# Patient Record
Sex: Female | Born: 1960 | Race: White
Health system: Southern US, Community
[De-identification: ages and names within clinical notes are randomized; demographics above are authoritative.]

## PROBLEM LIST (undated history)

## (undated) DIAGNOSIS — Z9889 Other specified postprocedural states: Secondary | ICD-10-CM

## (undated) HISTORY — DX: Other specified postprocedural states: Z98.890

---

## 2016-07-17 ENCOUNTER — Other Ambulatory Visit: Payer: Self-pay | Admitting: Rehabilitation

## 2016-07-17 DIAGNOSIS — M5432 Sciatica, left side: Principal | ICD-10-CM

## 2016-07-17 DIAGNOSIS — M5431 Sciatica, right side: Secondary | ICD-10-CM

## 2016-07-25 ENCOUNTER — Other Ambulatory Visit: Payer: Self-pay | Admitting: Rehabilitation

## 2016-07-25 ENCOUNTER — Ambulatory Visit
Admission: RE | Admit: 2016-07-25 | Discharge: 2016-07-25 | Disposition: A | Discharge status: Home / Self Care | Payer: BLUE CROSS/BLUE SHIELD | Source: Ambulatory Visit | Attending: Rehabilitation | Admitting: Rehabilitation

## 2016-07-25 DIAGNOSIS — M5432 Sciatica, left side: Principal | ICD-10-CM

## 2016-07-25 DIAGNOSIS — M5431 Sciatica, right side: Secondary | ICD-10-CM

## 2016-08-02 ENCOUNTER — Encounter: Payer: Self-pay | Admitting: Radiology

## 2016-08-02 ENCOUNTER — Ambulatory Visit
Admission: RE | Admit: 2016-08-02 | Discharge: 2016-08-02 | Disposition: A | Discharge status: Home / Self Care | Payer: BLUE CROSS/BLUE SHIELD | Source: Ambulatory Visit | Attending: Rehabilitation | Admitting: Rehabilitation

## 2016-08-02 DIAGNOSIS — M5432 Sciatica, left side: Principal | ICD-10-CM

## 2016-08-02 DIAGNOSIS — M5431 Sciatica, right side: Secondary | ICD-10-CM

## 2016-08-02 MED ORDER — GADOBENATE DIMEGLUMINE 529 MG/ML IV SOLN
17.0000 mL | Freq: Once | INTRAVENOUS | Status: AC | PRN
  Administered 2016-08-02: 17 mL via INTRAVENOUS

## 2017-05-06 ENCOUNTER — Other Ambulatory Visit: Payer: Self-pay | Admitting: Orthopaedic Surgery

## 2017-05-06 DIAGNOSIS — M546 Pain in thoracic spine: Secondary | ICD-10-CM

## 2017-05-11 ENCOUNTER — Ambulatory Visit
Admission: RE | Admit: 2017-05-11 | Discharge: 2017-05-11 | Disposition: A | Discharge status: Home / Self Care | Payer: BLUE CROSS/BLUE SHIELD | Source: Ambulatory Visit | Attending: Orthopaedic Surgery | Admitting: Orthopaedic Surgery

## 2017-05-11 DIAGNOSIS — M546 Pain in thoracic spine: Secondary | ICD-10-CM

## 2017-05-24 DIAGNOSIS — Z9689 Presence of other specified functional implants: Secondary | ICD-10-CM

## 2017-05-24 HISTORY — DX: Presence of other specified functional implants: Z96.89

## 2018-06-15 ENCOUNTER — Other Ambulatory Visit: Payer: Self-pay | Admitting: Orthopaedic Surgery

## 2018-06-15 DIAGNOSIS — M545 Low back pain: Secondary | ICD-10-CM

## 2018-06-22 ENCOUNTER — Other Ambulatory Visit: Payer: Self-pay | Admitting: Orthopaedic Surgery

## 2018-06-22 DIAGNOSIS — M545 Low back pain: Principal | ICD-10-CM

## 2018-06-22 DIAGNOSIS — G8929 Other chronic pain: Secondary | ICD-10-CM

## 2018-06-23 ENCOUNTER — Telehealth: Payer: Self-pay | Admitting: Nurse Practitioner

## 2018-06-23 NOTE — Telephone Encounter (Signed)
Phone call to patient to verify medication list and allergies for myelogram procedure. Pt instructed to stop Cymbalta, Phenergan, Imitrex, Trazodone and Elavil 48 hrs prior to myelogram appointment time. Pt verbalized understanding.

## 2018-07-05 ENCOUNTER — Ambulatory Visit
Admission: RE | Admit: 2018-07-05 | Discharge: 2018-07-05 | Disposition: A | Discharge status: Home / Self Care | Payer: BLUE CROSS/BLUE SHIELD | Source: Ambulatory Visit | Attending: Orthopaedic Surgery | Admitting: Orthopaedic Surgery

## 2018-07-05 DIAGNOSIS — M545 Low back pain: Principal | ICD-10-CM

## 2018-07-05 DIAGNOSIS — G8929 Other chronic pain: Secondary | ICD-10-CM

## 2018-07-05 MED ORDER — IOPAMIDOL (ISOVUE-M 200) INJECTION 41%
15.0000 mL | Freq: Once | INTRAMUSCULAR | Status: AC
  Administered 2018-07-05: 15 mL via INTRATHECAL

## 2018-07-05 MED ORDER — DIAZEPAM 5 MG PO TABS
10.0000 mg | ORAL_TABLET | Freq: Once | ORAL | Status: AC
  Administered 2018-07-05: 5 mg via ORAL

## 2018-07-05 MED ORDER — MEPERIDINE HCL 50 MG/ML IJ SOLN
50.0000 mg | Freq: Once | INTRAMUSCULAR | Status: AC
  Administered 2018-07-05: 50 mg via INTRAMUSCULAR

## 2018-07-05 MED ORDER — ONDANSETRON HCL 4 MG/2ML IJ SOLN
4.0000 mg | Freq: Once | INTRAMUSCULAR | Status: AC
  Administered 2018-07-05: 4 mg via INTRAMUSCULAR

## 2018-07-05 NOTE — Discharge Instructions (Signed)
Myelogram Discharge Instructions  1. Go home and rest quietly for the next 24 hours.  It is important to lie flat for the next 24 hours.  Get up only to go to the restroom.  You may lie in the bed or on a couch on your back, your stomach, your left side or your right side.  You may have one pillow under your head.  You may have pillows between your knees while you are on your side or under your knees while you are on your back.  2. DO NOT drive today.  Recline the seat as far back as it will go, while still wearing your seat belt, on the way home.  3. You may get up to go to the bathroom as needed.  You may sit up for 10 minutes to eat.  You may resume your normal diet and medications unless otherwise indicated.  Drink lots of extra fluids today and tomorrow.  4. The incidence of headache, nausea, or vomiting is about 5% (one in 20 patients).  If you develop a headache, lie flat and drink plenty of fluids until the headache goes away.  Caffeinated beverages may be helpful.  If you develop severe nausea and vomiting or a headache that does not go away with flat bed rest, call [phone].  5. You may resume normal activities after your 24 hours of bed rest is over; however, do not exert yourself strongly or do any heavy lifting tomorrow. If when you get up you have a headache when standing, go back to bed and force fluids for another 24 hours.  6. Call your physician for a follow-up appointment.  The results of your myelogram will be sent directly to your physician by the following day.  7. If you have any questions or if complications develop after you arrive home, please call [phone].  Discharge instructions have been explained to the patient.  The patient, or the person responsible for the patient, fully understands these instructions.   YOU MAY RESTART YOUR CYMBALTA, PHENERGAN, IMITREX, TRAZODONE AND ELAVIL TOMORROW 07/06/2018 AT 09:30AM.

## 2019-09-25 IMAGING — XA DG MYELOGRAPHY LUMBAR INJ LUMBOSACRAL
12 of 16 series · 12 of 16 positions shown · non-contrast
Comparison: MR 08/02/2016.

CLINICAL DATA: Low back pain. BILATERAL leg weakness has developed.
TECHNIQUE: Contiguous axial images were obtained through the Lumbar spine after
the intrathecal infusion of infusion. Coronal and sagittal
reconstructions were obtained of the axial image sets.

[Series 1: vasc adipose · 1 of 1 slices shown (1 of 12)]
[im 1/1]
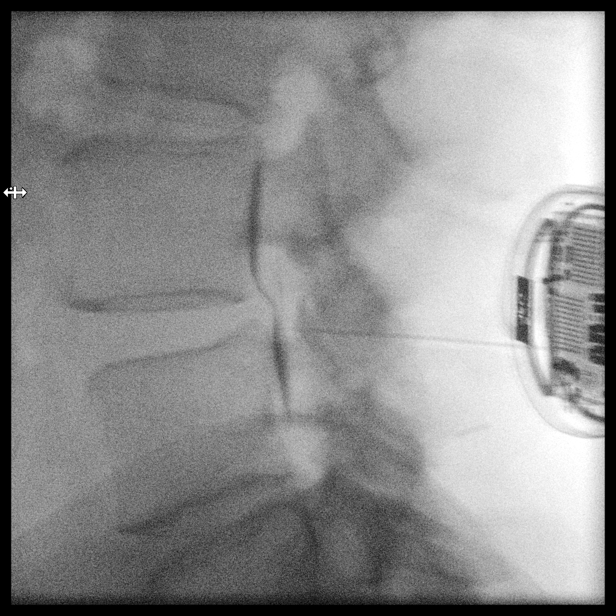

[Series 3: vasc adipose · 1 of 1 slices shown (2 of 12)]
[im 1/1]
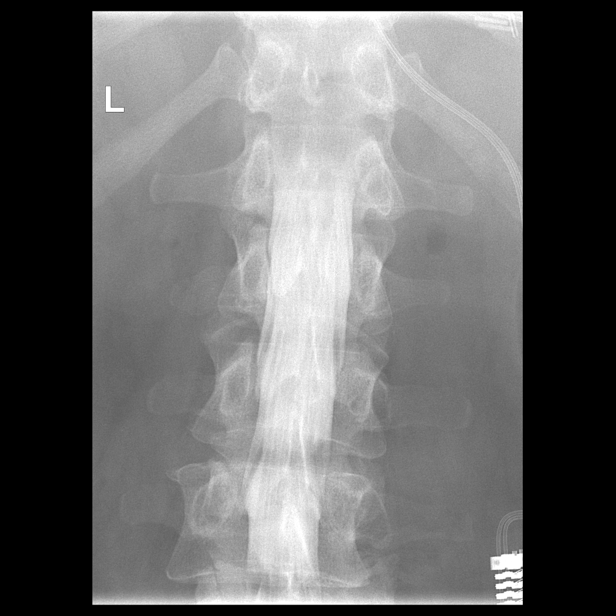

[Series 4: vasc adipose · 1 of 1 slices shown (3 of 12)]
[im 1/1]
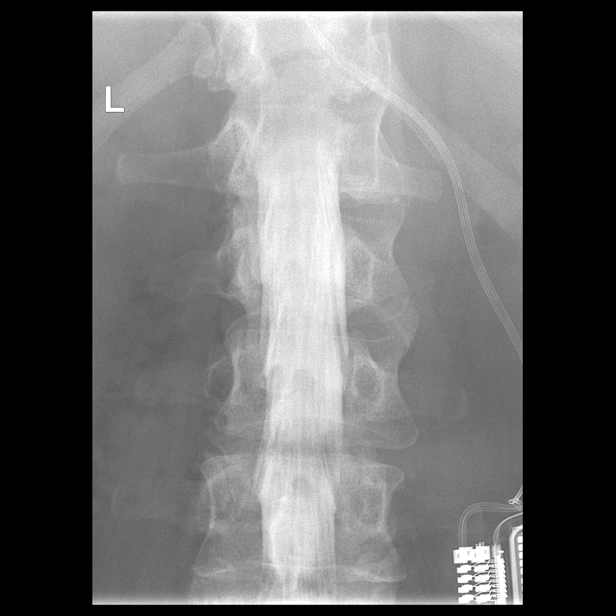

[Series 5: vasc adipose · 1 of 1 slices shown (4 of 12)]
[im 1/1]
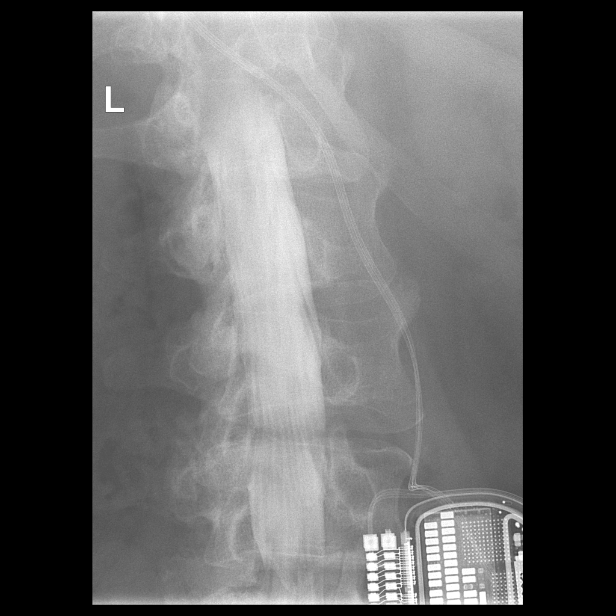

[Series 7: vasc adipose · 1 of 1 slices shown (5 of 12)]
[im 1/1]
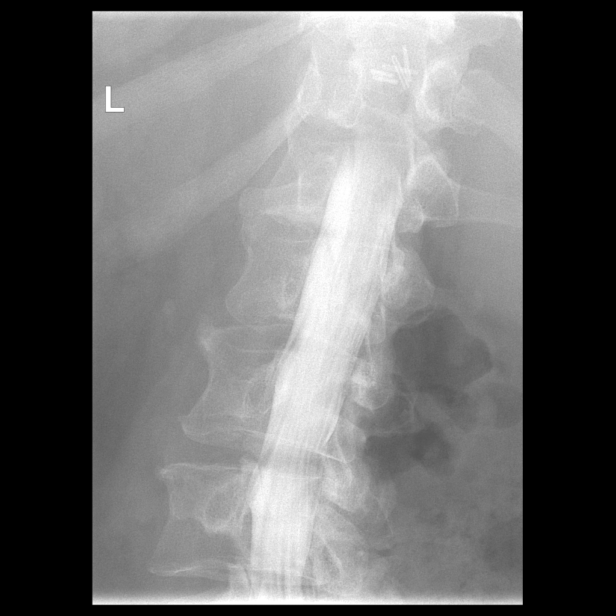

[Series 8: vasc adipose · 1 of 1 slices shown (6 of 12)]
[im 1/1]
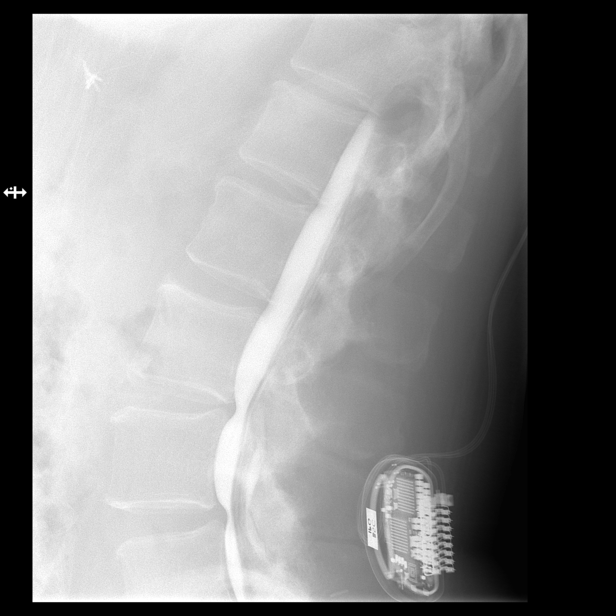

[Series 9: vasc adipose · 1 of 1 slices shown (7 of 12)]
[im 1/1]
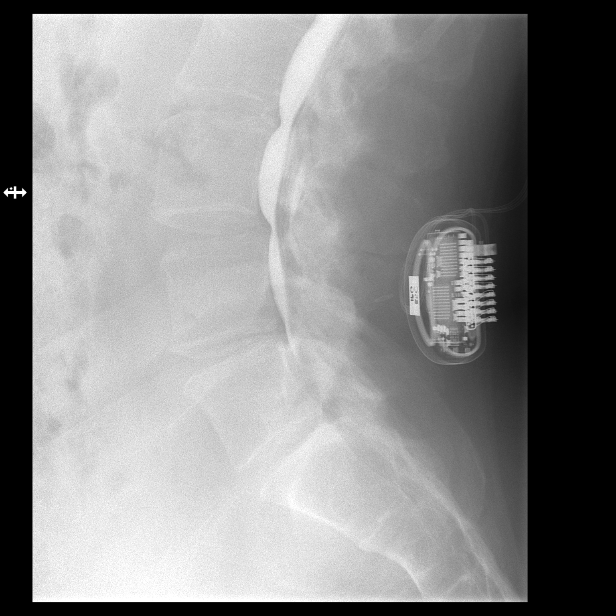

[Series 11: vasc adipose · 1 of 1 slices shown (8 of 12)]
[im 1/1]
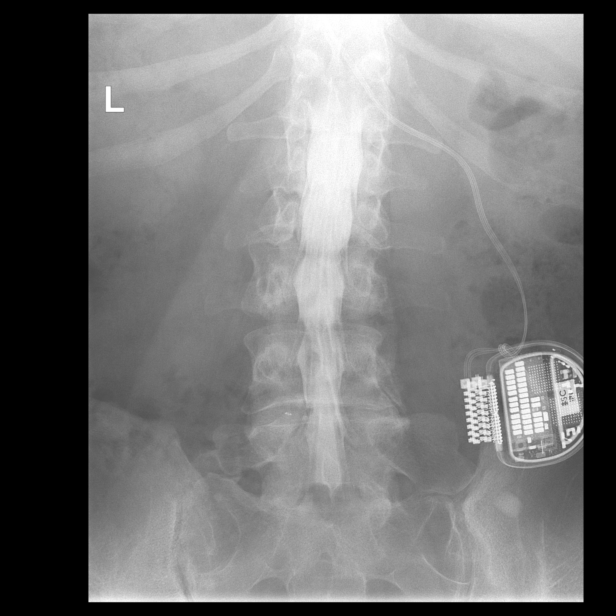

[Series 12: vasc adipose · 1 of 1 slices shown (9 of 12)]
[im 1/1]
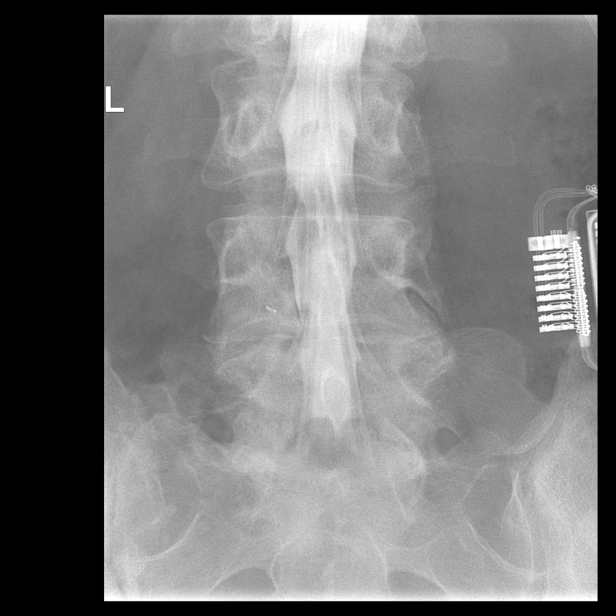

[Series 13: vasc adipose · 1 of 1 slices shown (10 of 12)]
[im 1/1]
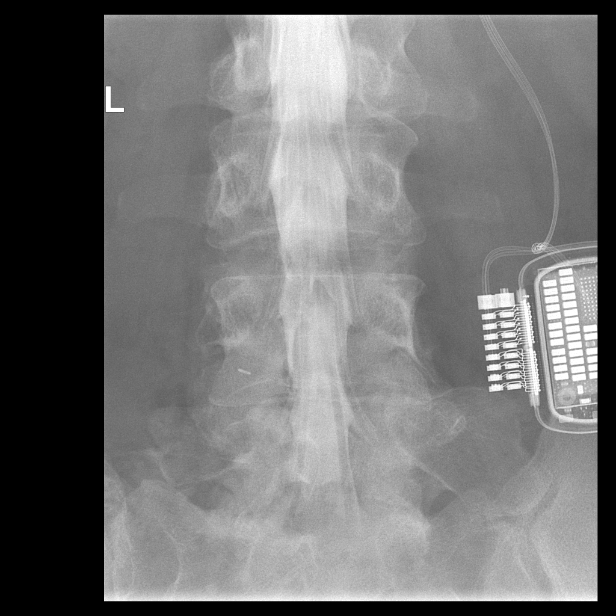

[Series 15: vasc adipose · 1 of 1 slices shown (11 of 12)]
[im 1/1]
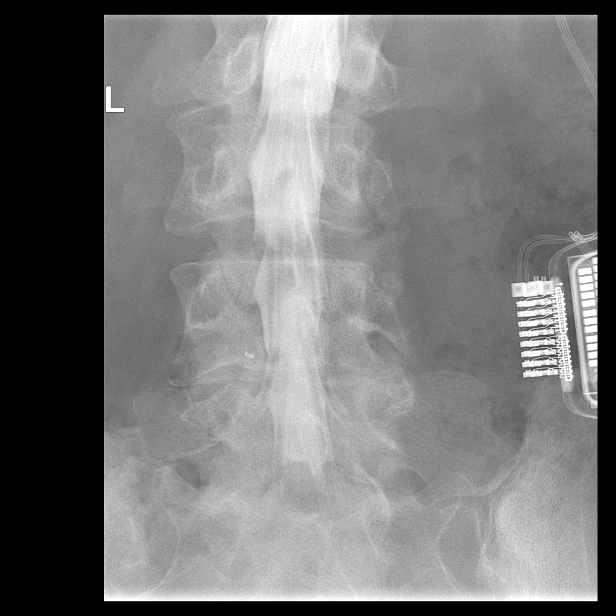

[Series 16: vasc adipose · 1 of 1 slices shown (12 of 12)]
[im 1/1]
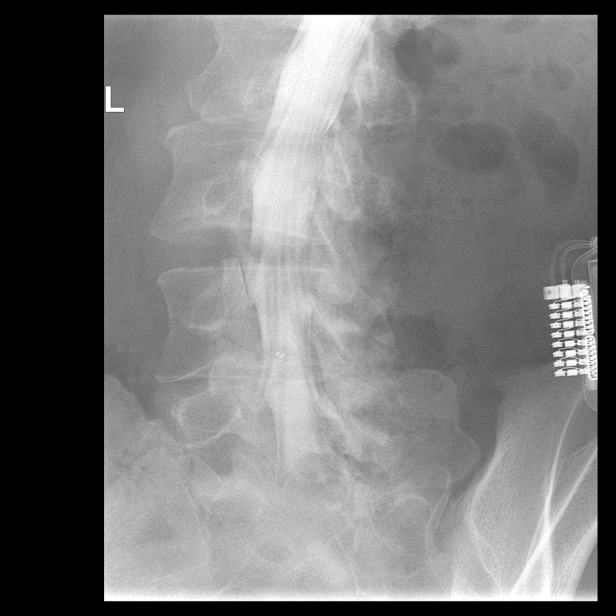

[12 of 16 positions shown; findings below may reference images not displayed]

EXAM:
LUMBAR MYELOGRAM

FLUOROSCOPY TIME:  1 minutes and 21 seconds corresponding to a Dose
Area Product of 440 Gy*m2

PROCEDURE:
After thorough discussion of risks and benefits of the procedure
including bleeding, infection, injury to nerves, blood vessels,
adjacent structures as well as headache and CSF leak, written and
oral informed consent was obtained. Consent was obtained by Dr. Oxendine
Jim. Time out form was completed.

Patient was positioned prone on the fluoroscopy table. Local
anesthesia was provided with 1% lidocaine without epinephrine after
prepped and draped in the usual sterile fashion. Puncture was
performed at L3-L4 using a 3 1/2 inch 22-gauge spinal needle via
midline approach. Using a single pass through the dura, the needle
was placed within the thecal sac, with return of clear CSF. 15 mL of
Isovue 2-4RR was injected into the thecal sac, with normal
opacification of the nerve roots and cauda equina consistent with
free flow within the subarachnoid space.

I personally performed the lumbar puncture and administered the
intrathecal contrast. I also personally supervised acquisition of
the myelogram images.
FINDINGS: LUMBAR MYELOGRAM FINDINGS:

Good opacification lumbar subarachnoid space. Transitional anatomy
at L5, hypertrophied RIGHT greater than LEFT transverse process.
Numbering scheme was employed to correlate with prior MR 08/02/2016.

There is mild to moderate multifactorial stenosis at L3-4, and mild
stenosis at L2-3 and L4-5. BILATERAL L4 nerve root encroachment at
the L3-4 level is the dominant finding. There is a shallow ventral
defect at L2-L3. Postsurgical changes seen at L4-5 on the LEFT,
small silver clip in the dorsal soft tissues. Minor LEFT L5
effacement in the subarticular zone.

Degenerative anterolisthesis at L3-4, measuring 4 mm, with patient
prone. Upright films could not be obtained.

Dorsal column stimulator in the RIGHT paraspinous soft tissues
appears uncomplicated. The leads and thoracic intraspinal component
were not evaluated as part of the lumbar myelogram.

CT LUMBAR MYELOGRAM FINDINGS:

Segmentation: Transitional, see discussion above.

Alignment:  3 mm anterolisthesis L3-4.

Vertebrae: No worrisome osseous lesion.

Conus medullaris: Normal in size,    and location.

Paraspinal tissues: No evidence for hydronephrosis or paravertebral
mass. Aortic atherosclerosis.

Disc levels:

L1-L2:  Normal.

L2-L3: Shallow central protrusion. Posterior element hypertrophy.
Mild stenosis. No compressive subarticular zone narrowing. Calcified
protrusion in the foramen could affect the LEFT L2 nerve root.

L3-L4: Moderate stenosis related to 3 mm slip, central protrusion,
and posterior element hypertrophy. BILATERAL subarticular zone
narrowing affects both L4 nerve roots, slightly greater on the
RIGHT. BILATERAL foraminal narrowing, not clearly compressive.

L4-L5: Advanced disc space narrowing, postsurgical. LEFT laminotomy.
Facet arthropathy. Mild stenosis. Subarticular zone and foraminal
zone narrowing on the LEFT could affect the L4 and L5 nerve roots.

L5-S1: Transitional level. Hypertrophied LEFT L5 transverse process.
Mild facet arthropathy. No impingement.
IMPRESSION: LUMBAR MYELOGRAM IMPRESSION:

Lumbar myelogram demonstrates multifactorial spinal stenosis most
pronounced at L3-4. Degenerative anterolisthesis of 4 mm on
myelography.

Patient unable to cooperate for standing flexion extension views;
standing lateral flexion extension views could be obtained at
another time to evaluate for dynamic instability.

CT LUMBAR MYELOGRAM IMPRESSION:

Moderate spinal stenosis at L3-4 related to 3 mm slip, central
protrusion, and posterior element hypertrophy. BILATERAL
subarticular zone narrowing affects the RIGHT greater than LEFT L4
nerve roots.

Postsurgical change L4-5, LEFT. Correlate clinically for symptomatic
LEFT L4/LEFT L5 neural impingement.

Shallow central protrusion L2-3 not clearly compressive. Foraminal
calcific disc at this level on the LEFT could affect the L2 nerve
root however.

Aortic Atherosclerosis ([id number]-[id number].E).

## 2019-12-13 ENCOUNTER — Other Ambulatory Visit: Payer: Self-pay | Admitting: Rehabilitation

## 2019-12-13 DIAGNOSIS — M47816 Spondylosis without myelopathy or radiculopathy, lumbar region: Secondary | ICD-10-CM

## 2019-12-14 ENCOUNTER — Telehealth: Payer: Self-pay | Admitting: Nurse Practitioner

## 2019-12-14 NOTE — Telephone Encounter (Signed)
Phone call to patient to verify medication list and allergies for myelogram procedure. Pt instructed to hold cymbalta, imitrex, trazodone, phenergan, and elavil for 48hrs prior to myelogram appointment time. Pt verbalized understanding. Pre and post procedure instructions reviewed with pt.

## 2019-12-18 ENCOUNTER — Ambulatory Visit
Admission: RE | Admit: 2019-12-18 | Discharge: 2019-12-18 | Disposition: A | Discharge status: Home / Self Care | Payer: BC Managed Care – PPO | Source: Ambulatory Visit | Attending: Rehabilitation | Admitting: Rehabilitation

## 2019-12-18 DIAGNOSIS — M47816 Spondylosis without myelopathy or radiculopathy, lumbar region: Secondary | ICD-10-CM

## 2019-12-18 MED ORDER — IOPAMIDOL (ISOVUE-M 200) INJECTION 41%
15.0000 mL | Freq: Once | INTRAMUSCULAR | Status: AC
  Administered 2019-12-18: 15 mL via INTRATHECAL

## 2019-12-18 MED ORDER — MEPERIDINE HCL 100 MG/ML IJ SOLN
75.0000 mg | Freq: Once | INTRAMUSCULAR | Status: AC
  Administered 2019-12-18: 75 mg via INTRAMUSCULAR

## 2019-12-18 MED ORDER — ONDANSETRON HCL 4 MG/2ML IJ SOLN
4.0000 mg | Freq: Once | INTRAMUSCULAR | Status: AC
  Administered 2019-12-18: 4 mg via INTRAMUSCULAR

## 2019-12-18 MED ORDER — DIAZEPAM 5 MG PO TABS
10.0000 mg | ORAL_TABLET | Freq: Once | ORAL | Status: AC
  Administered 2019-12-18: 10 mg via ORAL

## 2019-12-18 NOTE — Discharge Instructions (Signed)
Myelogram Discharge Instructions  1. Go home and rest quietly for the next 24 hours.  It is important to lie flat for the next 24 hours.  Get up only to go to the restroom.  You may lie in the bed or on a couch on your back, your stomach, your left side or your right side.  You may have one pillow under your head.  You may have pillows between your knees while you are on your side or under your knees while you are on your back.  2. DO NOT drive today.  Recline the seat as far back as it will go, while still wearing your seat belt, on the way home.  3. You may get up to go to the bathroom as needed.  You may sit up for 10 minutes to eat.  You may resume your normal diet and medications unless otherwise indicated.  Drink plenty of extra fluids today and tomorrow.  4. The incidence of a spinal headache with nausea and/or vomiting is about 5% (one in 20 patients).  If you develop a headache, lie flat and drink plenty of fluids until the headache goes away.  Caffeinated beverages may be helpful.  If you develop severe nausea and vomiting or a headache that does not go away with flat bed rest, call [phone].  5. You may resume normal activities after your 24 hours of bed rest is over; however, do not exert yourself strongly or do any heavy lifting tomorrow.  6. Call your physician for a follow-up appointment.    You may resume Cymbalta, Elavil, Imitrex, Phenergan and Trazodone on Tuesday, December 19, 2019 after 1:30p.m.

## 2019-12-18 NOTE — Progress Notes (Signed)
Patient states she has been off Cymbalta, Elavil, Imitrex, Phenergan and Trazodone for at least the past two days.
